# Patient Record
Sex: Male | Born: 1970 | Race: White | Hispanic: No | Smoking: Current some day smoker
Health system: Southern US, Community
[De-identification: ages and names within clinical notes are randomized; demographics above are authoritative.]

---

## 2005-06-19 ENCOUNTER — Emergency Department: Payer: Self-pay | Admitting: Emergency Medicine

## 2009-06-15 ENCOUNTER — Emergency Department: Payer: Self-pay | Admitting: Emergency Medicine

## 2011-07-21 ENCOUNTER — Emergency Department: Payer: Self-pay | Admitting: Emergency Medicine

## 2014-06-03 ENCOUNTER — Emergency Department: Payer: Self-pay | Admitting: Emergency Medicine

## 2014-06-03 LAB — URINALYSIS, COMPLETE
BACTERIA: NONE SEEN
BILIRUBIN, UR: NEGATIVE
BLOOD: NEGATIVE
Glucose,UR: NEGATIVE mg/dL (ref 0–75)
KETONE: NEGATIVE
LEUKOCYTE ESTERASE: NEGATIVE
Nitrite: NEGATIVE
Ph: 6 (ref 4.5–8.0)
Protein: NEGATIVE
RBC,UR: <1 /HPF (ref 0–5)
SPECIFIC GRAVITY: 1.024 (ref 1.003–1.030)
Squamous Epithelial: <1
WBC UR: <1 /HPF (ref 0–5)

## 2017-03-31 ENCOUNTER — Encounter: Payer: Self-pay | Admitting: Emergency Medicine

## 2017-03-31 ENCOUNTER — Emergency Department
Admission: EM | Admit: 2017-03-31 | Discharge: 2017-03-31 | Disposition: A | Discharge status: Home / Self Care | Payer: Self-pay | Attending: Emergency Medicine | Admitting: Emergency Medicine

## 2017-03-31 ENCOUNTER — Emergency Department: Payer: Self-pay

## 2017-03-31 ENCOUNTER — Other Ambulatory Visit: Payer: Self-pay

## 2017-03-31 DIAGNOSIS — W11XXXA Fall on and from ladder, initial encounter: Secondary | ICD-10-CM | POA: Insufficient documentation

## 2017-03-31 DIAGNOSIS — Y939 Activity, unspecified: Secondary | ICD-10-CM | POA: Insufficient documentation

## 2017-03-31 DIAGNOSIS — Y999 Unspecified external cause status: Secondary | ICD-10-CM | POA: Insufficient documentation

## 2017-03-31 DIAGNOSIS — Y929 Unspecified place or not applicable: Secondary | ICD-10-CM | POA: Insufficient documentation

## 2017-03-31 DIAGNOSIS — S92001A Unspecified fracture of right calcaneus, initial encounter for closed fracture: Secondary | ICD-10-CM | POA: Insufficient documentation

## 2017-03-31 DIAGNOSIS — F17200 Nicotine dependence, unspecified, uncomplicated: Secondary | ICD-10-CM | POA: Insufficient documentation

## 2017-03-31 MED ORDER — NAPROXEN 500 MG PO TABS
500.0000 mg | ORAL_TABLET | Freq: Once | ORAL | Status: AC
  Administered 2017-03-31: 500 mg via ORAL

## 2017-03-31 MED ORDER — IBUPROFEN 800 MG PO TABS: 800.0000 mg | ORAL_TABLET | Freq: Three times a day (TID) | ORAL | 0 refills | Status: DC | PRN

## 2017-03-31 MED ORDER — OXYCODONE-ACETAMINOPHEN 7.5-325 MG PO TABS: 1.0000 | ORAL_TABLET | Freq: Four times a day (QID) | ORAL | 0 refills | Status: DC | PRN

## 2017-03-31 MED ORDER — OXYCODONE-ACETAMINOPHEN 5-325 MG PO TABS
1.0000 | ORAL_TABLET | ORAL | Status: DC | PRN
  Administered 2017-03-31: 1 via ORAL
  Filled 2017-03-31: qty 1

## 2017-03-31 MED ORDER — NAPROXEN 500 MG PO TABS
ORAL_TABLET | ORAL | Status: AC
  Filled 2017-03-31: qty 1

## 2017-03-31 NOTE — Discharge Instructions (Signed)
Wear splint and ambulate with crutches until evaluation/treatment by orthopedic clinic.  He called today to schedule appointment.

## 2017-03-31 NOTE — ED Triage Notes (Signed)
First nurse note: left heel pain.  Pt states fell off ladder 12 feet in air.  States landed on feet.  Pt reports pain up into left leg and hip.

## 2017-03-31 NOTE — ED Provider Notes (Signed)
[facility] Emergency Department Provider Note   ____________________________________________   First MD Initiated Contact with Patient 03/31/17 1222     (approximate)  I have reviewed the triage vital signs and the nursing notes.   HISTORY  Chief Complaint Foot Pain    HPI Jeffrey Sartoriusaul D Benton is a 47 y.o. male patient state he lost balance and fell from a ladder emblematic shatter his left heel.  Patient unable to bear weight status post fall.  Patient fell approximately 12 feet.  Patient rates the pain as a 8/10.  Patient described the pain as "aching".  No pulses measured prior to arrival.  History reviewed. No pertinent past medical history.  There are no active problems to display for this patient.   History reviewed. No pertinent surgical history.  Prior to Admission medications   Medication Sig Start Date End Date Taking? Authorizing Provider  ibuprofen (ADVIL,MOTRIN) 800 MG tablet Take 1 tablet (800 mg total) by mouth every 8 (eight) hours as needed. 03/31/17   Joni ReiningSmith, Ronald K, PA-C  oxyCODONE-acetaminophen (PERCOCET) 7.5-325 MG tablet Take 1 tablet by mouth every 6 (six) hours as needed for severe pain. 03/31/17   Joni ReiningSmith, Ronald K, PA-C    Allergies Patient has no known allergies.  No family history on file.  Social History Social History   Tobacco Use  . Smoking status: Current Some Day Smoker  . Smokeless tobacco: Never Used  Substance Use Topics  . Alcohol use: Yes  . Drug use: No    Review of Systems Constitutional: No fever/chills Eyes: No visual changes. ENT: No sore throat. Cardiovascular: Denies chest pain. Respiratory: Denies shortness of breath. Gastrointestinal: No abdominal pain.  No nausea, no vomiting.  No diarrhea.  No constipation. Genitourinary: Negative for dysuria. Musculoskeletal: Left heel pain. Skin: Negative for rash. Neurological: Negative for headaches, focal weakness or  numbness.   ____________________________________________   PHYSICAL EXAM:  VITAL SIGNS: ED Triage Vitals  Enc Vitals Group     BP 03/31/17 1153 (!) 151/73     Pulse Rate 03/31/17 1153 77     Resp 03/31/17 1153 20     Temp 03/31/17 1153 97.7 F (36.5 C)     Temp Source 03/31/17 1153 Oral     SpO2 03/31/17 1153 98 %     Weight 03/31/17 1154 175 lb (79.4 kg)     Height --      Head Circumference --      Peak Flow --      Pain Score 03/31/17 1153 8     Pain Loc --      Pain Edu? --      Excl. in GC? --    Constitutional: Alert and oriented. Well appearing and in no acute distress. Cardiovascular: Normal rate, regular rhythm. Grossly normal heart sounds.  Good peripheral circulation.  Elevated blood pressure Respiratory: Normal respiratory effort.  No retractions. Lungs CTAB. Musculoskeletal: No obvious deformity to the left heel moderate edema is appreciated. Neurologic:  Normal speech and language. No gross focal neurologic deficits are appreciated. No gait instability. Skin:  Skin is warm, dry and intact. No rash noted. Psychiatric: Mood and affect are normal. Speech and behavior are normal.  ____________________________________________   LABS (all labs ordered are listed, but only abnormal results are displayed)  Labs Reviewed - No data to display ____________________________________________  EKG   ____________________________________________  RADIOLOGY  Dg Foot Complete Left  Result Date: 03/31/2017 CLINICAL DATA:  Fall from ladder with left  foot pain, initial encounter EXAM: LEFT FOOT - COMPLETE 3+ VIEW COMPARISON:  None. FINDINGS: There is a comminuted fracture involving the calcaneus with significant impaction of the fracture fragments identified. The distal tibia and fibula appear within normal limits. The talus as visualized is unremarkable. No other fractures are seen. IMPRESSION: Severely comminuted calcaneal fracture with impaction Electronically Signed    By: Alcide Clever M.D.   On: 03/31/2017 12:45    ____________________________________________   PROCEDURES  Procedure(s) performed: None  .Splint Application Date/Time: 03/31/2017 1:17 PM Performed by: Lindajo Royal, NT Authorized by: Joni Reining, PA-C   Consent:    Consent obtained:  Verbal   Consent given by:  Patient   Risks discussed:  Numbness and swelling Pre-procedure details:    Sensation:  Normal Procedure details:    Laterality:  Left   Location:  Ankle   Ankle:  L ankle   Strapping: no     Splint type:  Short leg   Supplies:  Cotton padding and Ortho-Glass Post-procedure details:    Pain:  Unchanged   Sensation:  Normal   Patient tolerance of procedure:  Tolerated well, no immediate complications    Critical Care performed: No  ____________________________________________   INITIAL IMPRESSION / ASSESSMENT AND PLAN / ED COURSE  As part of my medical decision making, I reviewed the following data within the electronic MEDICAL RECORD NUMBER    Discussed x-ray findings patient revealed a comminuted left heel fracture.  Discussed patient with on-call orthopedic surgeon who will follow up with patient tomorrow.  Patient placed in a posterior splint and given crutches for ambulation.  Patient advised take medication as directed.  Status post application of splint patient was found to be neurovascular intact.      ____________________________________________   FINAL CLINICAL IMPRESSION(S) / ED DIAGNOSES  Final diagnoses:  Closed nondisplaced fracture of right calcaneus, unspecified portion of calcaneus, initial encounter     ED Discharge Orders        Ordered    oxyCODONE-acetaminophen (PERCOCET) 7.5-325 MG tablet  Every 6 hours PRN     03/31/17 1307    ibuprofen (ADVIL,MOTRIN) 800 MG tablet  Every 8 hours PRN     03/31/17 1307       Note:  This document was prepared using Dragon voice recognition software and may include unintentional  dictation errors.    Joni Reining, PA-C 03/31/17 1317    Joni Reining, PA-C 03/31/17 1320    Sharman Cheek, MD 03/31/17 1524

## 2017-03-31 NOTE — ED Notes (Signed)
Was discharging the patient and then it was decided to do mri.

## 2017-03-31 NOTE — ED Notes (Signed)
See triage note  States he fell from ladder approx [id number] landed on feet  Having pain to left heel  Swelling and bruising noted to ankle area .  Positive pulses

## 2017-03-31 NOTE — ED Triage Notes (Signed)
Pt states he lost his balance while on a ladder then jumped of ladder and thinks he might have shattered his left heel. Unable to bear weight.LROM.

## 2017-12-28 ENCOUNTER — Emergency Department
Admission: EM | Admit: 2017-12-28 | Discharge: 2017-12-28 | Disposition: A | Discharge status: Home / Self Care | Payer: Self-pay | Attending: Emergency Medicine | Admitting: Emergency Medicine

## 2017-12-28 ENCOUNTER — Other Ambulatory Visit: Payer: Self-pay

## 2017-12-28 DIAGNOSIS — Z79899 Other long term (current) drug therapy: Secondary | ICD-10-CM | POA: Insufficient documentation

## 2017-12-28 DIAGNOSIS — R002 Palpitations: Secondary | ICD-10-CM | POA: Insufficient documentation

## 2017-12-28 DIAGNOSIS — F17200 Nicotine dependence, unspecified, uncomplicated: Secondary | ICD-10-CM | POA: Insufficient documentation

## 2017-12-28 DIAGNOSIS — I471 Supraventricular tachycardia: Secondary | ICD-10-CM | POA: Insufficient documentation

## 2017-12-28 LAB — MAGNESIUM: MAGNESIUM: 2 mg/dL (ref 1.7–2.4)

## 2017-12-28 LAB — CBC WITH DIFFERENTIAL/PLATELET
Abs Immature Granulocytes: 0.06 10*3/uL (ref 0.00–0.07)
Basophils Absolute: 0.1 10*3/uL (ref 0.0–0.1)
Basophils Relative: 1 %
EOS PCT: 2 %
Eosinophils Absolute: 0.2 10*3/uL (ref 0.0–0.5)
HCT: 41.2 % (ref 39.0–52.0)
HEMOGLOBIN: 13.7 g/dL (ref 13.0–17.0)
Immature Granulocytes: 1 %
LYMPHS PCT: 19 %
Lymphs Abs: 2.2 10*3/uL (ref 0.7–4.0)
MCH: 29.5 pg (ref 26.0–34.0)
MCHC: 33.3 g/dL (ref 30.0–36.0)
MCV: 88.6 fL (ref 80.0–100.0)
MONO ABS: 0.6 10*3/uL (ref 0.1–1.0)
Monocytes Relative: 5 %
Neutro Abs: 8.8 10*3/uL — ABNORMAL HIGH (ref 1.7–7.7)
Neutrophils Relative %: 72 %
Platelets: 282 10*3/uL (ref 150–400)
RBC: 4.65 MIL/uL (ref 4.22–5.81)
RDW: 13.6 % (ref 11.5–15.5)
WBC: 12 10*3/uL — AB (ref 4.0–10.5)
nRBC: 0 % (ref 0.0–0.2)

## 2017-12-28 LAB — TROPONIN I: Troponin I: <0.03 ng/mL (ref <0.03)

## 2017-12-28 LAB — BASIC METABOLIC PANEL
Anion gap: 6 (ref 5–15)
BUN: 14 mg/dL (ref 6–20)
CHLORIDE: 112 mmol/L — AB (ref 98–111)
CO2: 24 mmol/L (ref 22–32)
CREATININE: 0.74 mg/dL (ref 0.61–1.24)
Calcium: 9.1 mg/dL (ref 8.9–10.3)
GFR calc Af Amer: >60 mL/min (ref >60)
Glucose, Bld: 93 mg/dL (ref 70–99)
Potassium: 4 mmol/L (ref 3.5–5.1)
SODIUM: 142 mmol/L (ref 135–145)

## 2017-12-28 LAB — TSH: TSH: 1.457 u[IU]/mL (ref 0.350–4.500)

## 2017-12-28 LAB — T4, FREE: Free T4: 0.71 ng/dL — ABNORMAL LOW (ref 0.82–1.77)

## 2017-12-28 MED ORDER — METOPROLOL TARTRATE 25 MG PO TABS: 25.0000 mg | ORAL_TABLET | Freq: Two times a day (BID) | ORAL | 0 refills | Status: AC

## 2017-12-28 NOTE — ED Triage Notes (Signed)
PT was at work and started having chest pain. EMS reported SVT w/ rate of 170's. Pt converted on his own and is stable at this time and denies any pain.

## 2017-12-28 NOTE — ED Provider Notes (Signed)
[facility] Emergency Department Provider Note  ____________________________________________  Time seen: Approximately 1:49 PM  I have reviewed the triage vital signs and the nursing notes.   HISTORY  Chief Complaint Palpitations    HPI Jeffrey Benton is a 47 y.o. male with no significant past medical history who complains of intermittent chest tightness over the past several days.  Not exertional, not pleuritic.  No associated shortness of breath diaphoresis vomiting or dizziness.  Last night he also is noticing frequent irregular heartbeat, and today he had an episode where he suddenly became very rapid heartbeat associated with feeling jittery.  EMS report that the patient was in SVT which converted spontaneously to sinus rhythm during transport.  Patient denies any current complaints.  No stimulant abuse, excessive nicotine or caffeine, no decongestants.  Takes no medicines.  Never had anything like this before.      History reviewed. No pertinent past medical history.   There are no active problems to display for this patient.    History reviewed. No pertinent surgical history.   Prior to Admission medications   Medication Sig Start Date End Date Taking? Authorizing Provider  ibuprofen (ADVIL,MOTRIN) 800 MG tablet Take 1 tablet (800 mg total) by mouth every 8 (eight) hours as needed. 03/31/17   Joni Reining, PA-C  metoprolol tartrate (LOPRESSOR) 25 MG tablet Take 1 tablet (25 mg total) by mouth 2 (two) times daily. 12/28/17 12/28/18  Sharman Cheek, MD  oxyCODONE-acetaminophen (PERCOCET) 7.5-325 MG tablet Take 1 tablet by mouth every 6 (six) hours as needed for severe pain. 03/31/17   Joni Reining, PA-C     Allergies Patient has no known allergies.   History reviewed. No pertinent family history.  Social History Social History   Tobacco Use  . Smoking status: Current Some Day Smoker  . Smokeless tobacco: Never Used  Substance  Use Topics  . Alcohol use: Yes  . Drug use: No    Review of Systems  Constitutional:   No fever or chills.  ENT:   No sore throat. No rhinorrhea. Cardiovascular: Positive as above chest pain without syncope.  Positive palpitations Respiratory:   No dyspnea or cough. Gastrointestinal:   Negative for abdominal pain, vomiting and diarrhea.  Musculoskeletal:   Negative for focal pain or swelling All other systems reviewed and are negative except as documented above in ROS and HPI.  ____________________________________________   PHYSICAL EXAM:  VITAL SIGNS: ED Triage Vitals  Enc Vitals Group     BP 12/28/17 1113 (!) 144/113     Pulse Rate 12/28/17 1113 74     Resp 12/28/17 1113 14     Temp 12/28/17 1113 97.9 F (36.6 C)     Temp Source 12/28/17 1113 Oral     SpO2 12/28/17 1113 98 %     Weight 12/28/17 1113 170 lb (77.1 kg)     Height 12/28/17 1113 5\' 10"  (1.778 m)     Head Circumference --      Peak Flow --      Pain Score 12/28/17 1109 0     Pain Loc --      Pain Edu? --      Excl. in GC? --     Vital signs reviewed, nursing assessments reviewed.   Constitutional:   Alert and oriented. Non-toxic appearance. Eyes:   Conjunctivae are normal. EOMI. PERRL. ENT      Head:   Normocephalic and atraumatic.      Nose:  No congestion/rhinnorhea.       Mouth/Throat:   MMM, no pharyngeal erythema. No peritonsillar mass.       Neck:   No meningismus. Full ROM. Hematological/Lymphatic/Immunilogical:   No cervical lymphadenopathy. Cardiovascular:   RRR. Symmetric bilateral radial and DP pulses.  No murmurs. Cap refill less than 2 seconds. Respiratory:   Normal respiratory effort without tachypnea/retractions. Breath sounds are clear and equal bilaterally. No wheezes/rales/rhonchi. Gastrointestinal:   Soft and nontender. Non distended. There is no CVA tenderness.  No rebound, rigidity, or guarding. Musculoskeletal:   Normal range of motion in all extremities. No joint effusions.   No lower extremity tenderness.  No edema. Neurologic:   Normal speech and language.  Motor grossly intact. No acute focal neurologic deficits are appreciated.  Skin:    Skin is warm, dry and intact. No rash noted.  No petechiae, purpura, or bullae.  ____________________________________________    LABS (pertinent positives/negatives) (all labs ordered are listed, but only abnormal results are displayed) Labs Reviewed  BASIC METABOLIC PANEL - Abnormal; Notable for the following components:      Result Value   Chloride 112 (*)    All other components within normal limits  CBC WITH DIFFERENTIAL/PLATELET - Abnormal; Notable for the following components:   WBC 12.0 (*)    Neutro Abs 8.8 (*)    All other components within normal limits  T4, FREE - Abnormal; Notable for the following components:   Free T4 0.71 (*)    All other components within normal limits  MAGNESIUM  TSH  TROPONIN I   ____________________________________________   EKG  Interpreted by me  Date: 12/28/2017  Rate: 75  Rhythm: normal sinus rhythm  QRS Axis: normal  Intervals: normal  ST/T Wave abnormalities: normal  Conduction Disutrbances: none  Narrative Interpretation: unremarkable      ____________________________________________    RADIOLOGY  No results found.  ____________________________________________   PROCEDURES Procedures  ____________________________________________    CLINICAL IMPRESSION / ASSESSMENT AND PLAN / ED COURSE  Pertinent labs & imaging results that were available during my care of the patient were reviewed by me and considered in my medical decision making (see chart for details).    Patient presents with atypical chest pain and help rotations, and an episode of SVT as reported by EMS earlier today.  In the ED he is in sinus rhythm, not requiring any interventions.  Asymptomatic.  Labs including troponin thyroid studies and electrolytes all unremarkable.   Recommended twice daily metoprolol and follow-up with cardiology for further assessment.    Clinical Course as of Dec 29 1347  Mon Dec 28, 2017  1319 Labs negative.  With patient's frequent palpitations and episode of SVT today, will plan to refer to cardiology, start low-dose metoprolol for rate and symptom control.  Patient takes aspirin daily already which I advised him to continue until he sees cardiology.   [PS]    Clinical Course User Index [PS] Sharman Cheek, MD     ____________________________________________   FINAL CLINICAL IMPRESSION(S) / ED DIAGNOSES    Final diagnoses:  Palpitations  SVT (supraventricular tachycardia) [facility])     ED Discharge Orders         Ordered    metoprolol tartrate (LOPRESSOR) 25 MG tablet  2 times daily     12/28/17 1343          Portions of this note were generated with dragon dictation software. Dictation errors may occur despite best attempts at proofreading.    Scotty Court,  Aneta Mins, MD 12/28/17 1352

## 2017-12-28 NOTE — Discharge Instructions (Signed)
Take metoprolol 2 times a day as prescribed to control your symptoms and follow-up with cardiology for further assessment.

## 2017-12-30 ENCOUNTER — Telehealth: Payer: Self-pay

## 2017-12-30 NOTE — Telephone Encounter (Signed)
Lmov for patient to schedule ED fu seen on 12/28/17   Will try again at a later time

## 2017-12-31 NOTE — Telephone Encounter (Signed)
Lmov for patient to schedule ED fu seen on 12/28/17  ° °Will try again at a later time  °

## 2018-01-01 NOTE — Telephone Encounter (Signed)
Lmov for patient to schedule ED fu seen on 12/28/17  ° °Will try again at a later time  °

## 2019-01-23 ENCOUNTER — Emergency Department: Payer: Self-pay

## 2019-01-23 ENCOUNTER — Emergency Department
Admission: EM | Admit: 2019-01-23 | Discharge: 2019-01-23 | Disposition: A | Discharge status: Home / Self Care | Payer: Self-pay | Attending: Emergency Medicine | Admitting: Emergency Medicine

## 2019-01-23 ENCOUNTER — Other Ambulatory Visit: Payer: Self-pay

## 2019-01-23 DIAGNOSIS — F17210 Nicotine dependence, cigarettes, uncomplicated: Secondary | ICD-10-CM | POA: Insufficient documentation

## 2019-01-23 DIAGNOSIS — N50812 Left testicular pain: Secondary | ICD-10-CM | POA: Insufficient documentation

## 2019-01-23 DIAGNOSIS — L739 Follicular disorder, unspecified: Secondary | ICD-10-CM

## 2019-01-23 DIAGNOSIS — N50811 Right testicular pain: Secondary | ICD-10-CM | POA: Insufficient documentation

## 2019-01-23 DIAGNOSIS — N50819 Testicular pain, unspecified: Secondary | ICD-10-CM

## 2019-01-23 DIAGNOSIS — R319 Hematuria, unspecified: Secondary | ICD-10-CM

## 2019-01-23 LAB — CBC
HCT: 48.1 % (ref 39.0–52.0)
Hemoglobin: 16.3 g/dL (ref 13.0–17.0)
MCH: 29.3 pg (ref 26.0–34.0)
MCHC: 33.9 g/dL (ref 30.0–36.0)
MCV: 86.5 fL (ref 80.0–100.0)
Platelets: 386 10*3/uL (ref 150–400)
RBC: 5.56 MIL/uL (ref 4.22–5.81)
RDW: 12.8 % (ref 11.5–15.5)
WBC: 10.8 10*3/uL — ABNORMAL HIGH (ref 4.0–10.5)
nRBC: 0 % (ref 0.0–0.2)

## 2019-01-23 LAB — URINALYSIS, COMPLETE (UACMP) WITH MICROSCOPIC
Bacteria, UA: NONE SEEN
Bilirubin Urine: NEGATIVE
Glucose, UA: NEGATIVE mg/dL
Hgb urine dipstick: NEGATIVE
Ketones, ur: NEGATIVE mg/dL
Leukocytes,Ua: NEGATIVE
Nitrite: NEGATIVE
Protein, ur: NEGATIVE mg/dL
Specific Gravity, Urine: 1.026 (ref 1.005–1.030)
Squamous Epithelial / LPF: NONE SEEN (ref 0–5)
pH: 5 (ref 5.0–8.0)

## 2019-01-23 LAB — BASIC METABOLIC PANEL
Anion gap: 11 (ref 5–15)
BUN: 16 mg/dL (ref 6–20)
CO2: 25 mmol/L (ref 22–32)
Calcium: 9.6 mg/dL (ref 8.9–10.3)
Chloride: 103 mmol/L (ref 98–111)
Creatinine, Ser: 1.12 mg/dL (ref 0.61–1.24)
GFR calc Af Amer: >60 mL/min (ref >60)
GFR calc non Af Amer: >60 mL/min (ref >60)
Glucose, Bld: 83 mg/dL (ref 70–99)
Potassium: 4.4 mmol/L (ref 3.5–5.1)
Sodium: 139 mmol/L (ref 135–145)

## 2019-01-23 MED ORDER — DOXYCYCLINE HYCLATE 100 MG PO TABS: 100.0000 mg | ORAL_TABLET | Freq: Two times a day (BID) | ORAL | 0 refills | Status: DC

## 2019-01-23 MED ORDER — CEFTRIAXONE SODIUM 1 G IJ SOLR
1.0000 g | Freq: Once | INTRAMUSCULAR | Status: AC
  Administered 2019-01-23: 1 g via INTRAMUSCULAR
  Filled 2019-01-23: qty 10

## 2019-01-23 MED ORDER — AZITHROMYCIN 500 MG PO TABS
1000.0000 mg | ORAL_TABLET | Freq: Once | ORAL | Status: AC
  Administered 2019-01-23: 21:00:00 1000 mg via ORAL
  Filled 2019-01-23: qty 2

## 2019-01-23 NOTE — ED Triage Notes (Signed)
Pt states hematuria on and off x 2 weeks. States noticed blood in stool 2 days ago for 1 episode (normal BM). States tenderness to testicles. Denies taking blood thinners. Also c/o 7 abscesses to L axilla. Denies flank pain. Denies dysuria. Denies urinary frequency. Denies fever.

## 2019-01-23 NOTE — ED Notes (Signed)
Will hold 15 min for med hold.

## 2019-01-23 NOTE — ED Provider Notes (Signed)
[facility] Emergency Department Provider Note  ____________________________________________  Time seen: Approximately 7:03 PM  I have reviewed the triage vital signs and the nursing notes.   HISTORY  Chief Complaint Hematuria    HPI Jeffrey Benton is a 48 y.o. male who presents the emergency department with 2 concerns.  Patient has had intermittent hematuria for the past 2 weeks.  Patient reports 5 episodes of bright red blood in the toilet after urinating.  Patient states that he will have some pain in his testicles which is relieved with pressure to his testicles.  He denies any flank pain.  No suprapubic pain.  Patient states that the pain is intermittent as well as the hematuria.  No penile discharge.  Patient is willing to undergo STD testing.  Patient is also complaining of "bumps" to the left axilla.  Patient noticed these lesions developing over the past 2 to 3 days.  They are tender to touch.  No purulent drainage.  No history of recurring skin lesions.  Patient denies any fevers or chills, URI symptoms, chest pain, shortness of breath abdominal pain, nausea or vomiting.         History reviewed. No pertinent past medical history.  There are no active problems to display for this patient.   History reviewed. No pertinent surgical history.  Prior to Admission medications   Medication Sig Start Date End Date Taking? Authorizing Provider  doxycycline (VIBRA-TABS) 100 MG tablet Take 1 tablet (100 mg total) by mouth 2 (two) times daily. 01/23/19   Chucky Homes, Delorise Royals, PA-C  ibuprofen (ADVIL,MOTRIN) 800 MG tablet Take 1 tablet (800 mg total) by mouth every 8 (eight) hours as needed. 03/31/17   Joni Reining, PA-C  metoprolol tartrate (LOPRESSOR) 25 MG tablet Take 1 tablet (25 mg total) by mouth 2 (two) times daily. 12/28/17 12/28/18  Sharman Cheek, MD  oxyCODONE-acetaminophen (PERCOCET) 7.5-325 MG tablet Take 1 tablet by mouth every 6 (six)  hours as needed for severe pain. 03/31/17   Joni Reining, PA-C    Allergies Patient has no known allergies.  History reviewed. No pertinent family history.  Social History Social History   Tobacco Use  . Smoking status: Current Some Day Smoker  . Smokeless tobacco: Never Used  Substance Use Topics  . Alcohol use: Yes  . Drug use: No     Review of Systems  Constitutional: No fever/chills Eyes: No visual changes. No discharge ENT: No upper respiratory complaints. Cardiovascular: no chest pain. Respiratory: no cough. No SOB. Gastrointestinal: No abdominal pain.  No nausea, no vomiting.  No diarrhea.  No constipation. Genitourinary: Negative for dysuria.  Positive for hematuria and testicular pain Musculoskeletal: Negative for musculoskeletal pain. Skin: Negative for rash, abrasions, lacerations, ecchymosis.  Positive for skin lesions to the left axilla Neurological: Negative for headaches, focal weakness or numbness. 10-point ROS otherwise negative.  ____________________________________________   PHYSICAL EXAM:  VITAL SIGNS: ED Triage Vitals  Enc Vitals Group     BP 01/23/19 1720 (!) 155/128     Pulse Rate 01/23/19 1720 (!) 104     Resp --      Temp 01/23/19 1720 98.6 F (37 C)     Temp Source 01/23/19 1720 Oral     SpO2 01/23/19 1720 98 %     Weight 01/23/19 1720 170 lb (77.1 kg)     Height 01/23/19 1720 5\' 10"  (1.778 m)     Head Circumference --      Peak Flow --  Pain Score 01/23/19 1730 0     Pain Loc --      Pain Edu? --      Excl. in GC? --      Constitutional: Alert and oriented. Well appearing and in no acute distress. Eyes: Conjunctivae are normal. PERRL. EOMI. Head: Atraumatic. ENT:      Ears:       Nose: No congestion/rhinnorhea.      Mouth/Throat: Mucous membranes are moist.  Neck: No stridor.   Hematological/Lymphatic/Immunilogical: No cervical lymphadenopathy. Cardiovascular: Normal rate, regular rhythm. Normal S1 and S2.  Good  peripheral circulation. Respiratory: Normal respiratory effort without tachypnea or retractions. Lungs CTAB. Good air entry to the bases with no decreased or absent breath sounds. Gastrointestinal: Bowel sounds 4 quadrants. Soft and nontender to palpation. No guarding or rigidity. No palpable masses. No distention. No CVA tenderness. Genitourinary: No external lesions or chancres identified.  No purulent drainage.  Nontender to palpation along the shaft.  No palpable abnormality about the scrotum or testicles. Musculoskeletal: Full range of motion to all extremities. No gross deformities appreciated. Neurologic:  Normal speech and language. No gross focal neurologic deficits are appreciated.  Skin:  Skin is warm, dry and intact. No rash noted. Psychiatric: Mood and affect are normal. Speech and behavior are normal. Patient exhibits appropriate insight and judgement.   ____________________________________________   LABS (all labs ordered are listed, but only abnormal results are displayed)  Labs Reviewed  URINALYSIS, COMPLETE (UACMP) WITH MICROSCOPIC - Abnormal; Notable for the following components:      Result Value   Color, Urine YELLOW (*)    APPearance CLEAR (*)    All other components within normal limits  CBC - Abnormal; Notable for the following components:   WBC 10.8 (*)    All other components within normal limits  GC/CHLAMYDIA PROBE AMP  BASIC METABOLIC PANEL   ____________________________________________  EKG   ____________________________________________  RADIOLOGY I personally viewed and evaluated these images as part of my medical decision making, as well as reviewing the written report by the radiologist.  Dg Abdomen 1 View  Result Date: 01/23/2019 CLINICAL DATA:  48 year old male with intermittent gross hematuria for 2 weeks. Blood in stool x1 episode. Not on blood thinners. EXAM: ABDOMEN - 1 VIEW COMPARISON:  None. FINDINGS: Negative visible lung bases. Non  obstructed bowel gas pattern. Right upper quadrant calcifications clustered along the lateral margin of the right kidney more resemble cholelithiasis than nephrolithiasis, or alternatively might be costochondral calcifications. No other suspicious calcific foci. Other abdominal and pelvic visceral contours appear normal. No acute osseous abnormality identified. No pneumoperitoneum identified on this supine view. IMPRESSION: 1. Normal bowel gas pattern. 2. Small clustered calcifications in the right upper quadrant more resemble cholelithiasis or costochondral calcifications than nephrolithiasis. Electronically Signed   By: Odessa FlemingH  Hall M.D.   On: 01/23/2019 20:11   [place] Scrotum W/doppler  Result Date: 01/23/2019 CLINICAL DATA:  Initial evaluation for acute testicular pain with macroscopic hematuria. EXAM: SCROTAL ULTRASOUND DOPPLER ULTRASOUND OF THE TESTICLES TECHNIQUE: Complete ultrasound examination of the testicles, epididymis, and other scrotal structures was performed. Color and spectral Doppler ultrasound were also utilized to evaluate blood flow to the testicles. COMPARISON:  None. FINDINGS: Right testicle Measurements: 3.5 x 2.1 x 3.1 cm. No mass or microlithiasis visualized. Left testicle Measurements: 3.4 x 1.9 x 2.3 cm. No mass or microlithiasis visualized. Right epididymis:  Normal in size and appearance. Left epididymis:  Normal in size and appearance. Hydrocele:  None visualized.  Varicocele:  None visualized. Pulsed Doppler interrogation of both testes demonstrates normal low resistance arterial and venous waveforms bilaterally. IMPRESSION: Normal scrotal ultrasound. No findings to explain patient's symptoms identified. Electronically Signed   By: Jeannine Boga M.D.   On: 01/23/2019 20:23    ____________________________________________    PROCEDURES  Procedure(s) performed:    Procedures    Medications  cefTRIAXone (ROCEPHIN) injection 1 g (1 g Intramuscular Given 01/23/19 2043)   azithromycin (ZITHROMAX) tablet 1,000 mg (1,000 mg Oral Given 01/23/19 2043)     ____________________________________________   INITIAL IMPRESSION / ASSESSMENT AND PLAN / ED COURSE  Pertinent labs & imaging results that were available during my care of the patient were reviewed by me and considered in my medical decision making (see chart for details).  Review of the [place] CSRS was performed in accordance of the [place] prior to dispensing any controlled drugs.           Patient's diagnosis is consistent with hematuria, testicular pain, folliculitis.  Patient presented to emergency department with intermittent hematuria x2 weeks.  No hematuria today and none identified on urinalysis.  Patient has also had associated intermittent testicular pain.  Differential included testicular torsion, epididymitis, gonorrhea, chlamydia, urethritis, UTI.  Patient also had several lesions in the left axilla.  Examination is most consistent with folliculitis.  No evidence of abscess requiring incision and drainage.  Patient had abdominal x-ray to evaluate for nephrolithiasis, ultrasound to evaluate for testicular pain.  Both are reassuring at this time.  Patient was treated empirically with Rocephin, Zithromax.  Patient will be also discharged with prescription for doxycycline which will cover any mycoplasma urealyticum as well as cellulitis.  Follow-up primary care or urology as needed.  Patient is given ED precautions to return to the ED for any worsening or new symptoms.     ____________________________________________  FINAL CLINICAL IMPRESSION(S) / ED DIAGNOSES  Final diagnoses:  Testicle pain  Hematuria      NEW MEDICATIONS STARTED DURING THIS VISIT:  ED Discharge Orders         Ordered    doxycycline (VIBRA-TABS) 100 MG tablet  2 times daily     01/23/19 2023              This chart was dictated using voice recognition software/Dragon. Despite best efforts to proofread, errors can  occur which can change the meaning. Any change was purely unintentional.    Darletta Moll, PA-C 01/23/19 2045    Arta Silence, MD 01/23/19 2322

## 2019-02-02 LAB — GC/CHLAMYDIA PROBE AMP
Chlamydia trachomatis, NAA: NEGATIVE
Neisseria Gonorrhoeae by PCR: NEGATIVE

## 2019-10-08 ENCOUNTER — Other Ambulatory Visit: Payer: Self-pay

## 2019-10-08 ENCOUNTER — Emergency Department
Admission: EM | Admit: 2019-10-08 | Discharge: 2019-10-08 | Disposition: A | Discharge status: Home / Self Care | Payer: Medicaid Other | Attending: Emergency Medicine | Admitting: Emergency Medicine

## 2019-10-08 DIAGNOSIS — F17200 Nicotine dependence, unspecified, uncomplicated: Secondary | ICD-10-CM | POA: Insufficient documentation

## 2019-10-08 DIAGNOSIS — R519 Headache, unspecified: Secondary | ICD-10-CM | POA: Insufficient documentation

## 2019-10-08 DIAGNOSIS — H6121 Impacted cerumen, right ear: Secondary | ICD-10-CM | POA: Insufficient documentation

## 2019-10-08 MED ORDER — CARBAMIDE PEROXIDE 6.5 % OT SOLN
5.0000 [drp] | Freq: Once | OTIC | Status: AC
  Administered 2019-10-08: 15:00:00 5 [drp] via OTIC
  Filled 2019-10-08: qty 15

## 2019-10-08 NOTE — ED Provider Notes (Signed)
[facility] Emergency Department Provider Note   ____________________________________________   First MD Initiated Contact with Patient 10/08/19 1429     (approximate)  I have reviewed the triage vital signs and the nursing notes.   HISTORY  Chief Complaint Otalgia   HPI Jeffrey Benton is a 49 y.o. male presents to the ED with complaint of right ear pain that began shortly after swimming last weekend.  Patient has been using over-the-counter swimmer's ear drops and also hydrogen peroxide diluted with water.  He is also used sweet oil and eardrops for swimmer's ear without any relief.  Patient denies any URI symptoms.  He denies any trauma to his ear while swimming.  He does admit that he uses Q-tips to clean his ears.  He rates his pain as a 10/10.        History reviewed. No pertinent past medical history.  There are no problems to display for this patient.   History reviewed. No pertinent surgical history.  Prior to Admission medications   Medication Sig Start Date End Date Taking? Authorizing Provider  metoprolol tartrate (LOPRESSOR) 25 MG tablet Take 1 tablet (25 mg total) by mouth 2 (two) times daily. 12/28/17 12/28/18  Sharman Cheek, MD    Allergies Patient has no known allergies.  History reviewed. No pertinent family history.  Social History Social History   Tobacco Use  . Smoking status: Current Some Day Smoker  . Smokeless tobacco: Never Used  Substance Use Topics  . Alcohol use: Yes  . Drug use: No    Review of Systems Constitutional: No fever/chills Eyes: No visual changes. ENT: No sore throat.  Positive right ear pain. Cardiovascular: Denies chest pain. Respiratory: Denies shortness of breath. Musculoskeletal: Negative for muscle skeletal pain. Skin: Negative for rash. Neurological: Positive for headache. ____________________________________________   PHYSICAL EXAM:  VITAL SIGNS: ED Triage Vitals  Enc  Vitals Group     BP 10/08/19 1337 (!) 147/91     Pulse Rate 10/08/19 1337 77     Resp 10/08/19 1337 18     Temp 10/08/19 1337 98.6 F (37 C)     Temp Source 10/08/19 1337 Oral     SpO2 10/08/19 1337 98 %     Weight 10/08/19 1338 170 lb (77.1 kg)     Height 10/08/19 1338 5\' 11"  (1.803 m)     Head Circumference --      Peak Flow --      Pain Score 10/08/19 1338 10     Pain Loc --      Pain Edu? --      Excl. in GC? --     Constitutional: Alert and oriented. Well appearing and in no acute distress. Eyes: Conjunctivae are normal. PERRL. EOMI. Head: Atraumatic. Nose: No congestion/rhinnorhea. Ears: Left EAC and TM are clear.  Right EAC is occluded with cerumen.  TM is not visible at this time.  There is moderate tenderness on the visualization with a speculum.  No exudate is noted along the canal. Neck: No stridor.   Cardiovascular: Normal rate, regular rhythm. Grossly normal heart sounds.  Good peripheral circulation. Respiratory: Normal respiratory effort.  No retractions. Lungs CTAB. Musculoskeletal: Is able move upper and lower extremities with any difficulty normal gait was noted. Neurologic:  Normal speech and language. No gross focal neurologic deficits are appreciated.  Skin:  Skin is warm, dry and intact. No rash noted. Psychiatric: Mood and affect are normal. Speech and behavior are normal.  ____________________________________________   LABS (all labs ordered are listed, but only abnormal results are displayed)  Labs Reviewed - No data to display  PROCEDURES  Procedure(s) performed (including Critical Care):  Procedures   ____________________________________________   INITIAL IMPRESSION / ASSESSMENT AND PLAN / ED COURSE  As part of my medical decision making, I reviewed the following data within the electronic MEDICAL RECORD NUMBER Notes from prior ED visits and [place] Controlled Substance Database  ADRIENE PADULA was evaluated in Emergency Department on 10/08/2019  for the symptoms described in the history of present illness. He was evaluated in the context of the global COVID-19 pandemic, which necessitated consideration that the patient might be at risk for infection with the SARS-CoV-2 virus that causes COVID-19. Institutional protocols and algorithms that pertain to the evaluation of patients at risk for COVID-19 are in a state of rapid change based on information released by regulatory bodies including the CDC and federal and state organizations. These policies and algorithms were followed during the patient's care in the ED.  Patient has a cerumen impaction.  Debrox was placed in the ear canal and after approximately 30 minutes was irrigated with the canal completely clean and TM is visible.  Patient states is feeling much better and that he actually can hear.  Patient is encouraged not to use Q-tips in the future.  He is to follow-up with Dr. Jenne Campus if any continued problems with his ear.  ____________________________________________   FINAL CLINICAL IMPRESSION(S) / ED DIAGNOSES  Final diagnoses:  Impacted cerumen of right ear     ED Discharge Orders    None       Note:  This document was prepared using Dragon voice recognition software and may include unintentional dictation errors.    Tommi Rumps, PA-C 10/08/19 1630    Delton Prairie, MD 10/08/19 [phone]

## 2019-10-08 NOTE — ED Notes (Signed)
See triage note  Presents with right ear pain  States pain started several days ago

## 2019-10-08 NOTE — ED Triage Notes (Signed)
Pt arrives via POV with c/o a "massive ear ache". Pt reports getting water in the right ear while swimming last Sunday. Pt tried sweet oil, peroxide and water/alcohol mixture and swimmers ear drops with no relief. Pt reports the whole right side of the face hurts. Pt ambulatory from triage in NAD, skin warm and dry.

## 2019-10-08 NOTE — Discharge Instructions (Addendum)
Follow-up with your primary care provider or Dr. Jenne Campus if any continued problems.  Do not use Q-tips inside your ear.

## 2020-08-21 IMAGING — US US SCROTUM W/ DOPPLER COMPLETE
1 series · 14 of 25 positions shown · non-contrast
Comparison: None.

CLINICAL DATA: Initial evaluation for acute testicular pain with
macroscopic hematuria.

EXAM:
SCROTAL ULTRASOUND
DOPPLER ULTRASOUND OF THE TESTICLES
TECHNIQUE: Complete ultrasound examination of the testicles, epididymis, and
other scrotal structures was performed. Color and spectral Doppler
ultrasound were also utilized to evaluate blood flow to the
testicles.

[Series 1: us scrotum w/ doppler complete · 14 of 67 slices shown]
[im 1/67]
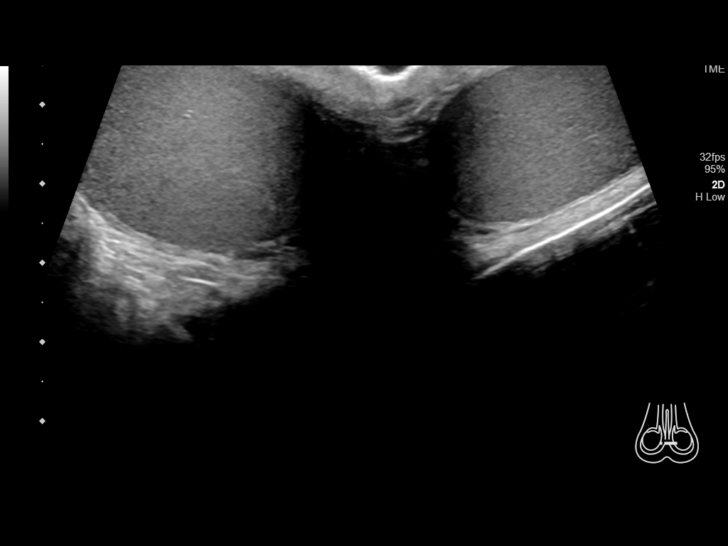
[im 6/67]
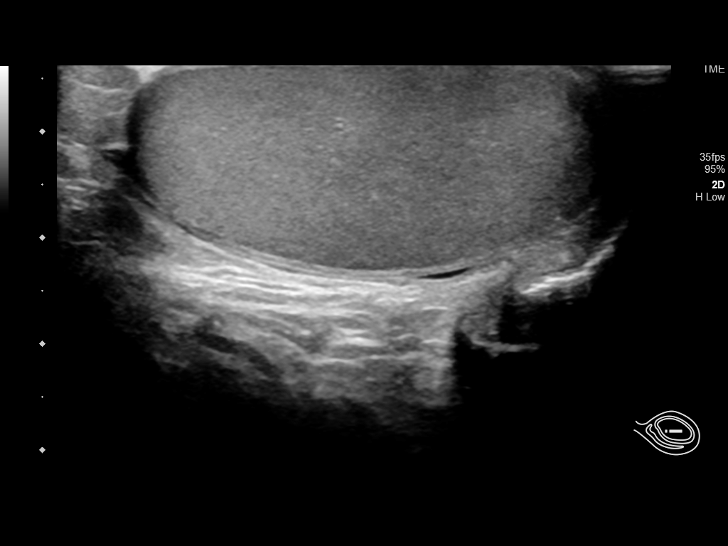
[im 12/67]
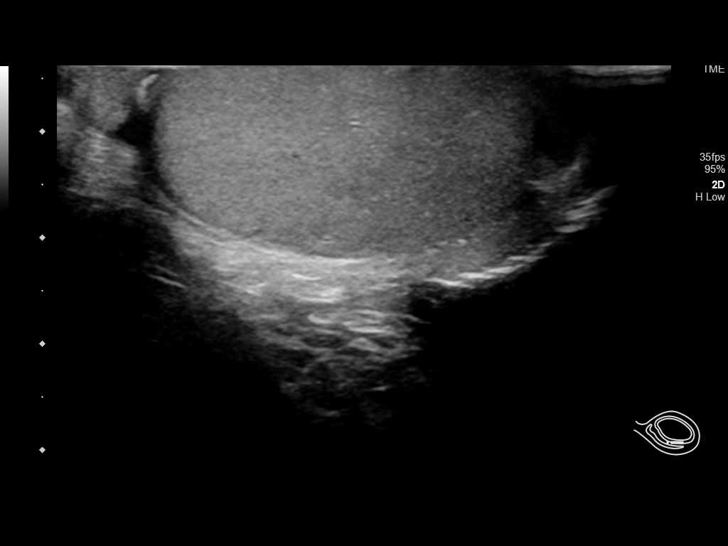
[im 17/67]
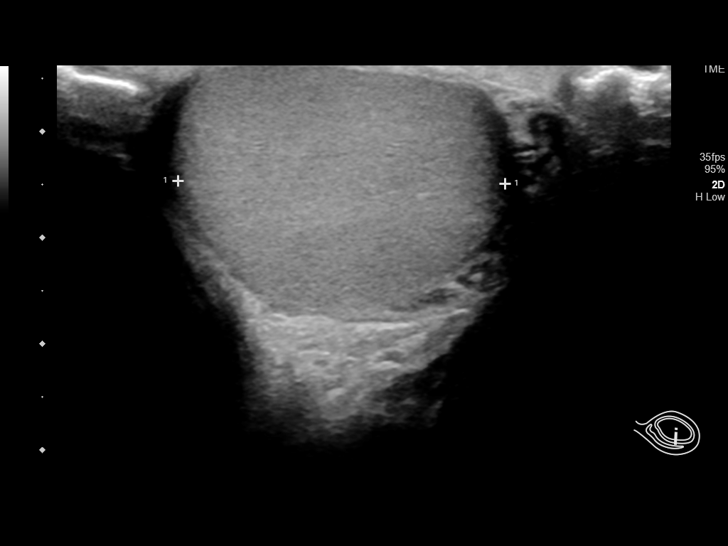
[im 23/67]
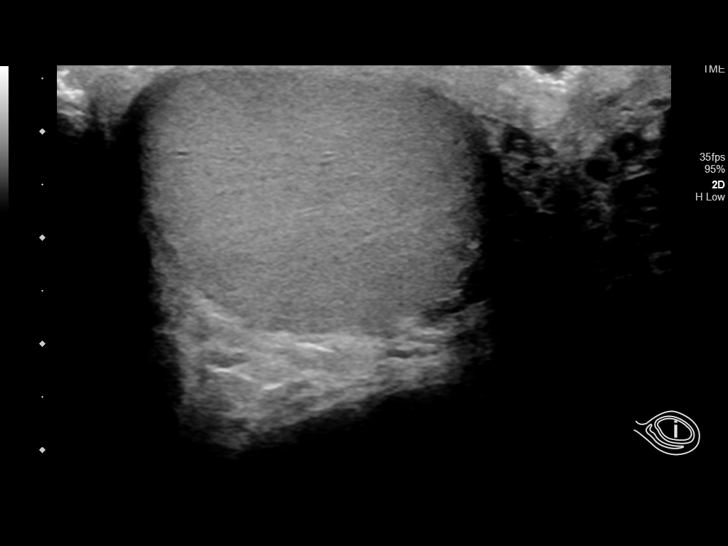
[im 25/67]
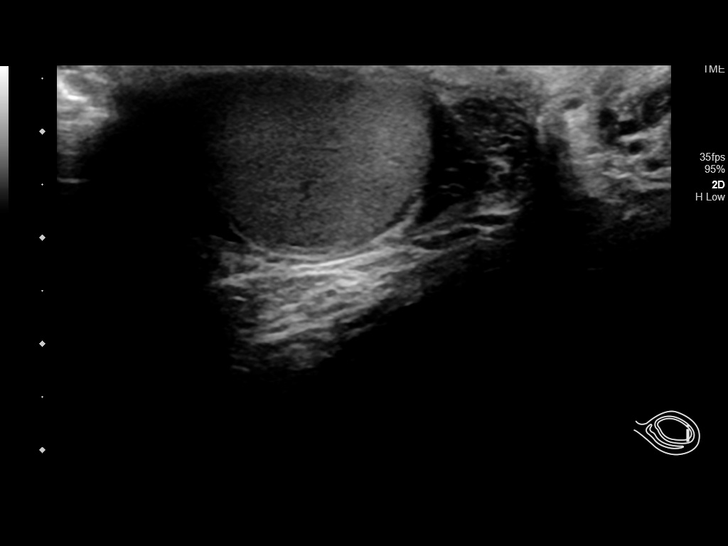
[im 31/67]
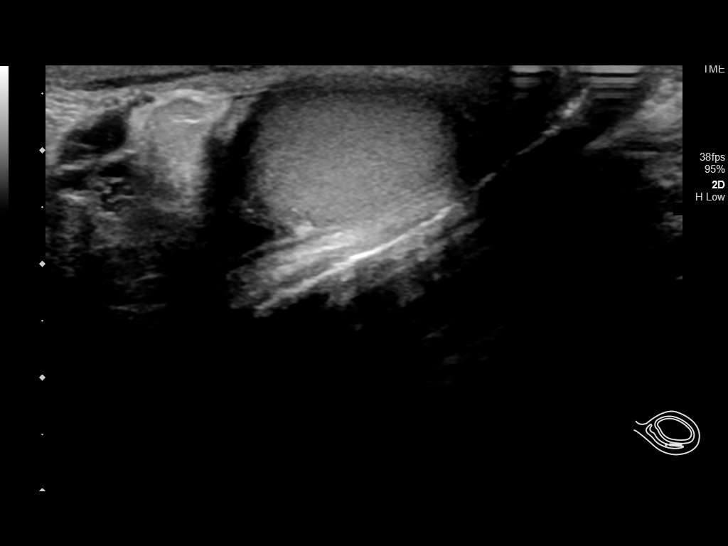
[im 36/67]
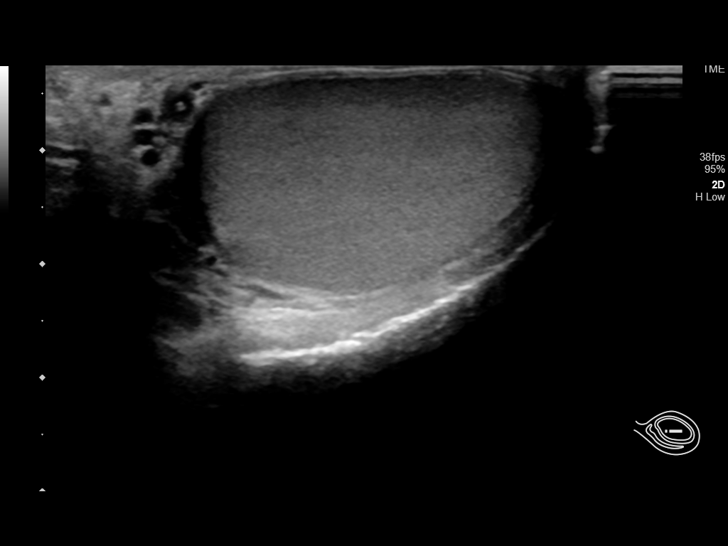
[im 42/67]
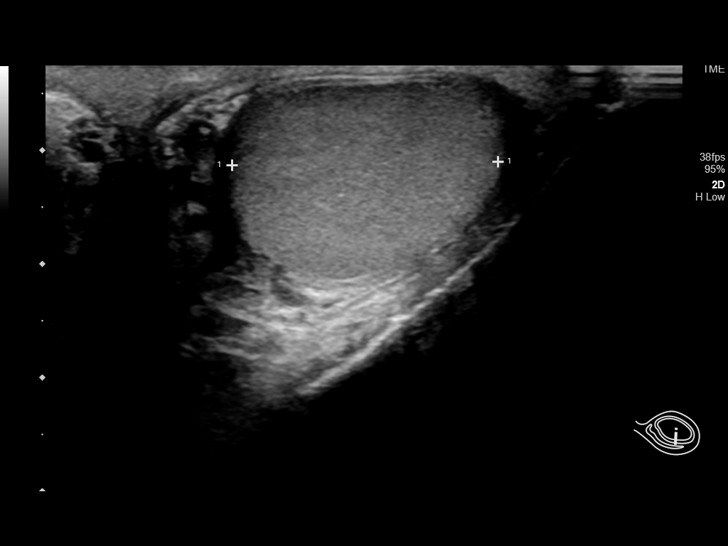
[im 45/67]
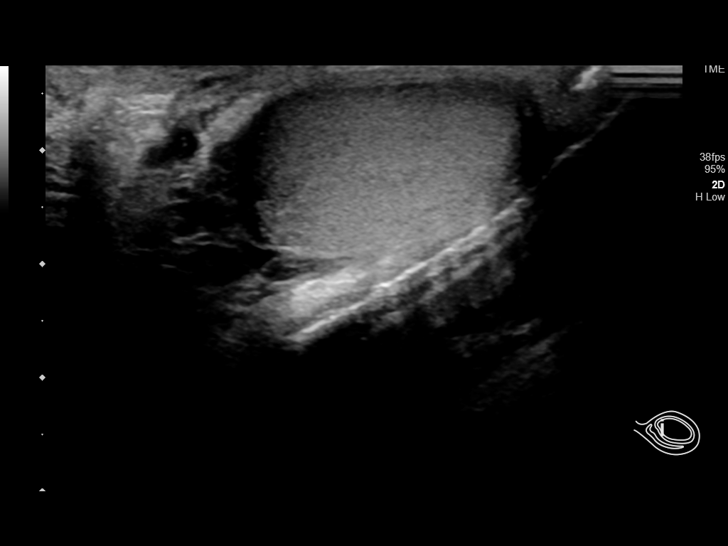
[im 50/67]
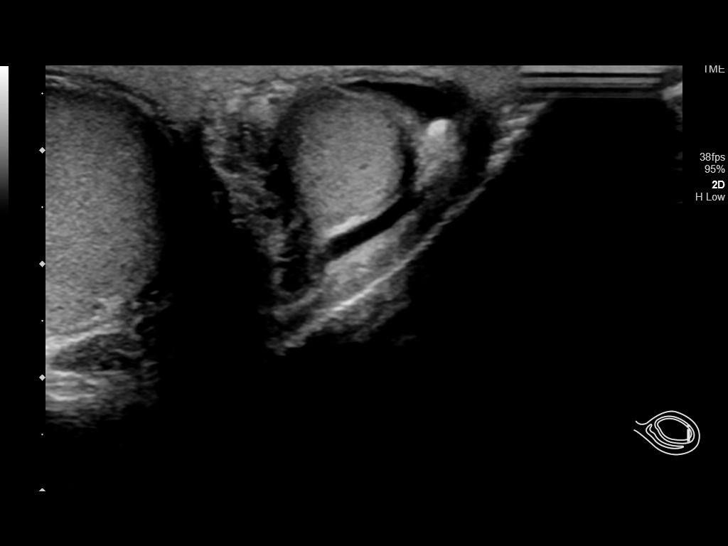
[im 56/67]
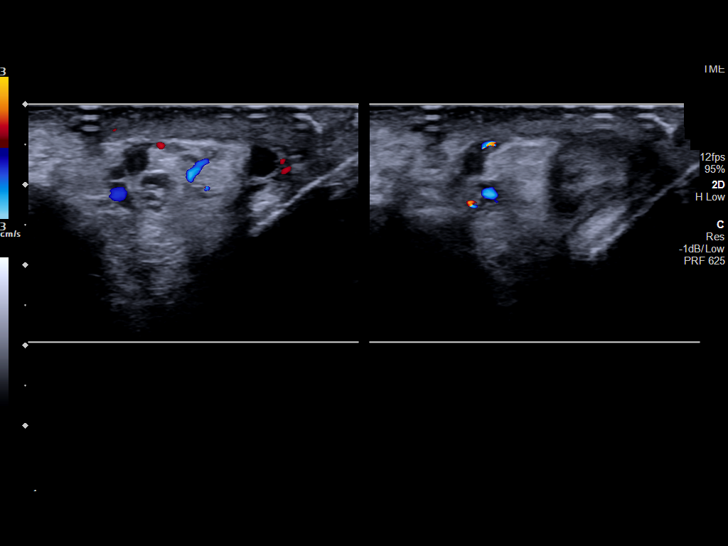
[im 61/67]
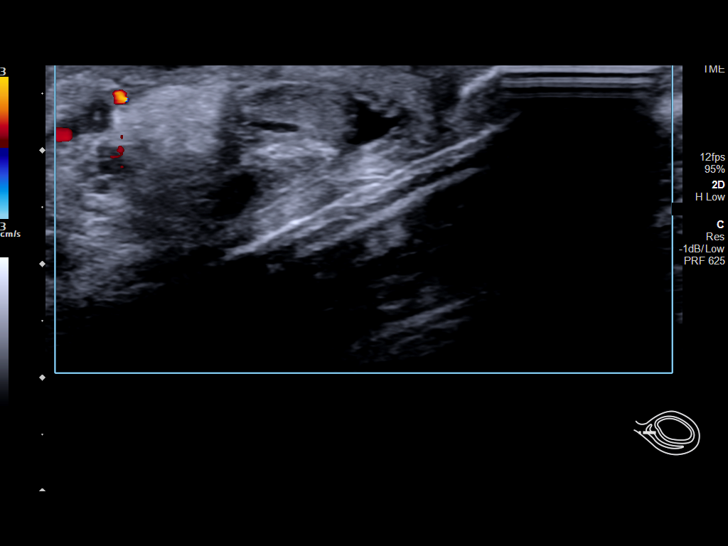
[im 67/67]
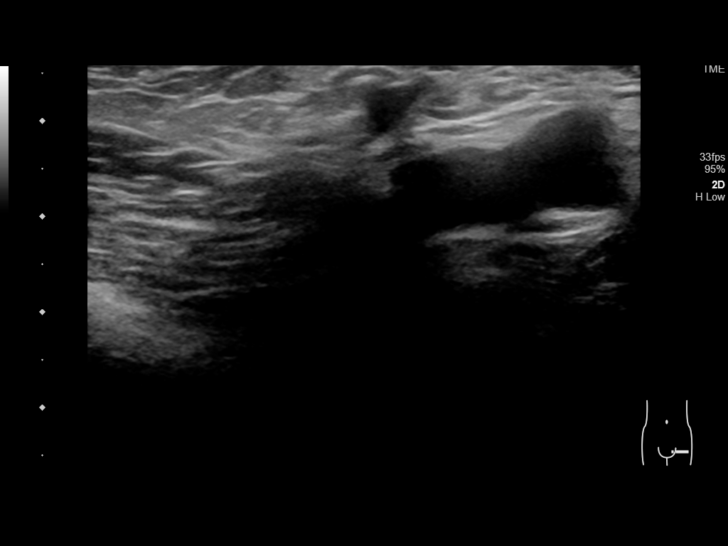

[14 of 25 positions shown; findings below may reference images not displayed]

FINDINGS: Right testicle

Measurements: 3.5 x 2.1 x 3.1 cm. No mass or microlithiasis
visualized.

Left testicle

Measurements: 3.4 x 1.9 x 2.3 cm. No mass or microlithiasis
visualized.

Right epididymis:  Normal in size and appearance.

Left epididymis:  Normal in size and appearance.

Hydrocele:  None visualized.

Varicocele:  None visualized.

Pulsed Doppler interrogation of both testes demonstrates normal low
resistance arterial and venous waveforms bilaterally.
IMPRESSION: Normal scrotal ultrasound. No findings to explain patient's symptoms
identified.

## 2020-12-10 ENCOUNTER — Other Ambulatory Visit: Payer: Self-pay

## 2020-12-10 ENCOUNTER — Emergency Department: Payer: Self-pay

## 2020-12-10 ENCOUNTER — Emergency Department
Admission: EM | Admit: 2020-12-10 | Discharge: 2020-12-10 | Disposition: A | Discharge status: Home / Self Care | Payer: Self-pay | Attending: Emergency Medicine | Admitting: Emergency Medicine

## 2020-12-10 DIAGNOSIS — M7051 Other bursitis of knee, right knee: Secondary | ICD-10-CM | POA: Insufficient documentation

## 2020-12-10 DIAGNOSIS — Y93E5 Activity, floor mopping and cleaning: Secondary | ICD-10-CM | POA: Insufficient documentation

## 2020-12-10 DIAGNOSIS — F17200 Nicotine dependence, unspecified, uncomplicated: Secondary | ICD-10-CM | POA: Insufficient documentation

## 2020-12-10 NOTE — ED Notes (Signed)
See triage note  presents with pain and swelling to right knee  denies any injury  but states he was on his knees putting in a floor  redness noted with some warmth to knee  ambulates well

## 2020-12-10 NOTE — ED Triage Notes (Signed)
Pt comes with c/o right knee pain that started last Tuesday. 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 Pt has redness and swelling noted to knee.

## 2020-12-10 NOTE — ED Provider Notes (Signed)
[facility] Emergency Department Provider Note  ____________________________________________   Event Date/Time   First MD Initiated Contact with Patient 12/10/20 1005     (approximate)  I have reviewed the triage vital signs and the nursing notes.   HISTORY  Chief Complaint Knee Pain    HPI Jeffrey Benton is a 50 y.o. male presents emergency department with right knee pain and swelling.  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  No numbness or tingling.  History reviewed. No pertinent past medical history.  There are no problems to display for this patient.   History reviewed. No pertinent surgical history.  Prior to Admission medications   Medication Sig Start Date End Date Taking? Authorizing Provider  metoprolol tartrate (LOPRESSOR) 25 MG tablet Take 1 tablet (25 mg total) by mouth 2 (two) times daily. 12/28/17 12/28/18  Sharman Cheek, MD    Allergies Patient has no known allergies.  No family history on file.  Social History Social History   Tobacco Use   Smoking status: Some Days   Smokeless tobacco: Never  Substance Use Topics   Alcohol use: Yes   Drug use: No    Review of Systems  Constitutional: No fever/chills Eyes: No visual changes. ENT: No sore throat. Respiratory: Denies cough Genitourinary: Negative for dysuria. Musculoskeletal: Negative for back pain.  Positive for right knee pain Skin: Negative for rash. Psychiatric: no mood changes,     ____________________________________________   PHYSICAL EXAM:  VITAL SIGNS: ED Triage Vitals  Enc Vitals Group     BP 12/10/20 0951 (!) 153/85     Pulse Rate 12/10/20 0951 82     Resp 12/10/20 0951 18     Temp 12/10/20 0951 98.8 F (37.1 C)     Temp Source 12/10/20 0951 Oral     SpO2 12/10/20 0951 97 %     Weight 12/10/20 1003 169 lb 15.6 oz (77.1 kg)     Height 12/10/20 1003 5\' 11"  (1.803 m)     Head  Circumference --      Peak Flow --      Pain Score 12/10/20 0952 8     Pain Loc --      Pain Edu? --      Excl. in GC? --     Constitutional: Alert and oriented. Well appearing and in no acute distress. Eyes: Conjunctivae are normal.  Head: Atraumatic. Nose: No congestion/rhinnorhea. Mouth/Throat: Mucous membranes are moist.   Neck:  supple no lymphadenopathy noted Cardiovascular: Normal rate, regular rhythm.  Respiratory: Normal respiratory effort.  No retractions, GU: deferred Musculoskeletal: FROM all extremities, warm and well perfused, patella bursa swollen and tender, mild warmth is noted, neurovascular is intact Neurologic:  Normal speech and language.  Skin:  Skin is warm, dry and intact. No rash noted. Psychiatric: Mood and affect are normal. Speech and behavior are normal.  ____________________________________________   LABS (all labs ordered are listed, but only abnormal results are displayed)  Labs Reviewed - No data to display ____________________________________________   ____________________________________________  RADIOLOGY  X-ray of the right knee  ____________________________________________   PROCEDURES  Procedure(s) performed: Knee brace applied by nursing staff   Procedures    ____________________________________________   INITIAL IMPRESSION / ASSESSMENT AND PLAN / ED COURSE  Pertinent labs & imaging results that were available during my care of the patient were reviewed by me and considered in my medical decision making (  see chart for details).   The patient is a 50 year old male presents with right knee pain.  See HPI.  Physical exam shows patient appears stable x-ray of the right knee reviewed by me confirmed by radiology to be negative for fracture but does show soft tissue swelling at the patella bursa.  I did explain all the findings to the patient.  Placed in knee brace.  He is to apply ice.  Take over-the-counter NSAIDs.  Follow-up  orthopedics if not improved in 1 week.  Work restrictions to include no kneeling for 1 week.  He was discharged stable condition     Jeffrey Benton was evaluated in Emergency Department on 12/10/2020 for the symptoms described in the history of present illness. He was evaluated in the context of the global COVID-19 pandemic, which necessitated consideration that the patient might be at risk for infection with the SARS-CoV-2 virus that causes COVID-19. Institutional protocols and algorithms that pertain to the evaluation of patients at risk for COVID-19 are in a state of rapid change based on information released by regulatory bodies including the CDC and federal and state organizations. These policies and algorithms were followed during the patient's care in the ED.    As part of my medical decision making, I reviewed the following data within the electronic MEDICAL RECORD NUMBER Nursing notes reviewed and incorporated, Old chart reviewed, Radiograph reviewed , Notes from prior ED visits, and [place] Controlled Substance Database  ____________________________________________   FINAL CLINICAL IMPRESSION(S) / ED DIAGNOSES  Final diagnoses:  Patellar bursitis of right knee      NEW MEDICATIONS STARTED DURING THIS VISIT:  New Prescriptions   No medications on file     Note:  This document was prepared using Dragon voice recognition software and may include unintentional dictation errors.    Faythe Ghee, PA-C 12/10/20 1152    Arnaldo Natal, MD 12/10/20 [phone]

## 2020-12-10 NOTE — Discharge Instructions (Signed)
Apply ice to the knee Do not kneel on the knee for at least 1 week Wear the knee brace Take otc ibuprofen or aleve to help with swelling and pain

## 2022-07-09 IMAGING — CR DG KNEE COMPLETE 4+V*R*
1 series · 4 of 4 positions shown · non-contrast
Comparison: None.

CLINICAL DATA: presents with pain and swelling to right knee x 1
week. Denies any injury but states he was on his knees putting in a
floor redness noted with some warmth to knee ambulates well.

EXAM:
RIGHT KNEE - COMPLETE 4 VIEW

[Series 1: dg knee complete 4 views right · 0.14mm/px · 4 of 4 slices shown]
[im 1/4]
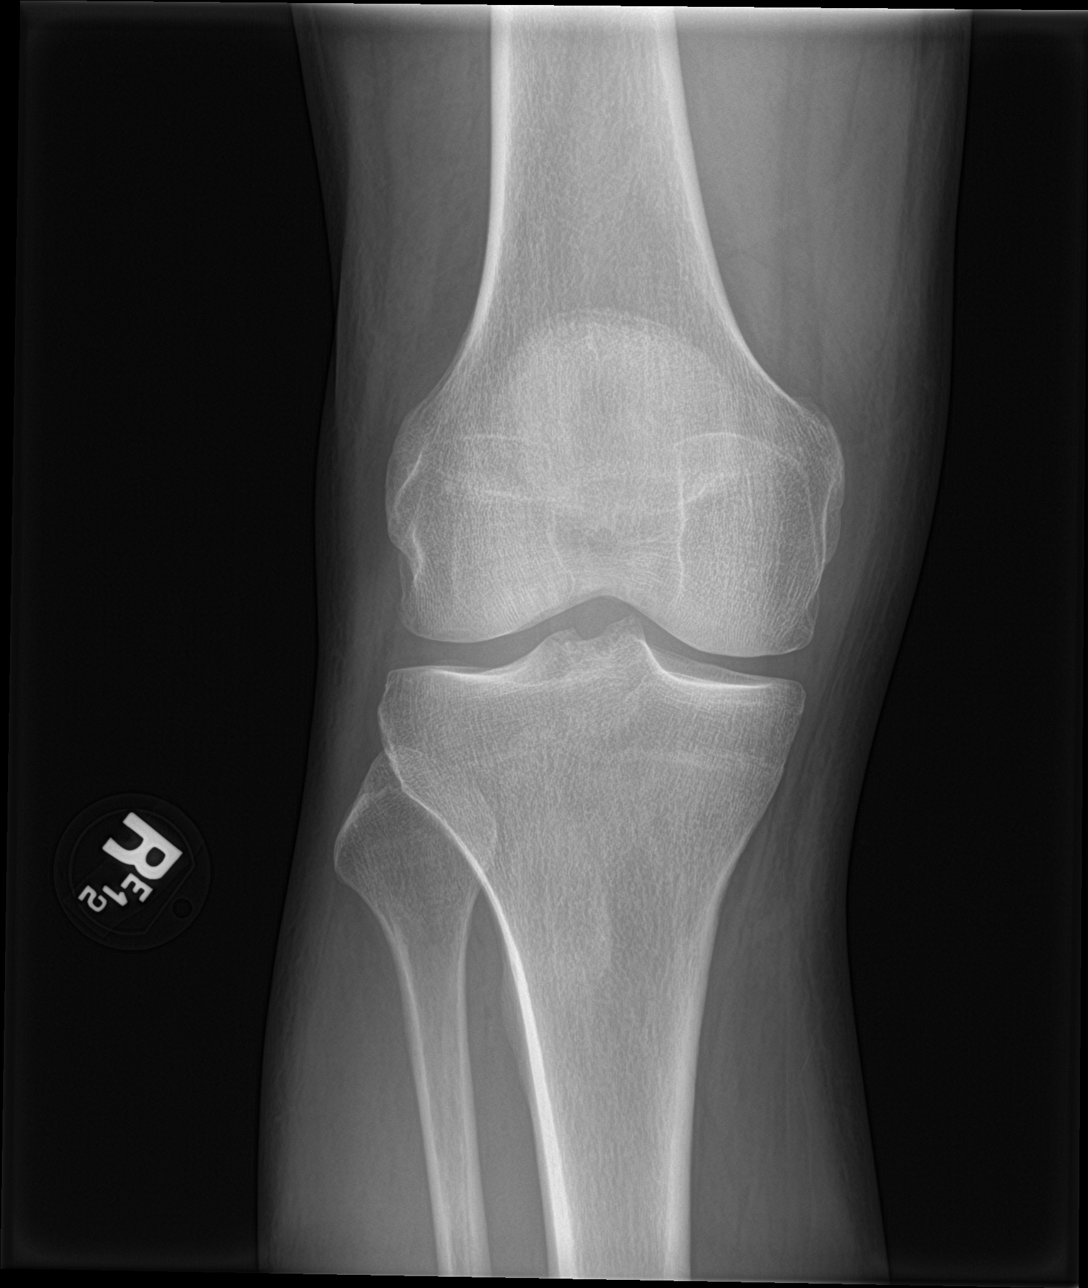
[im 2/4]
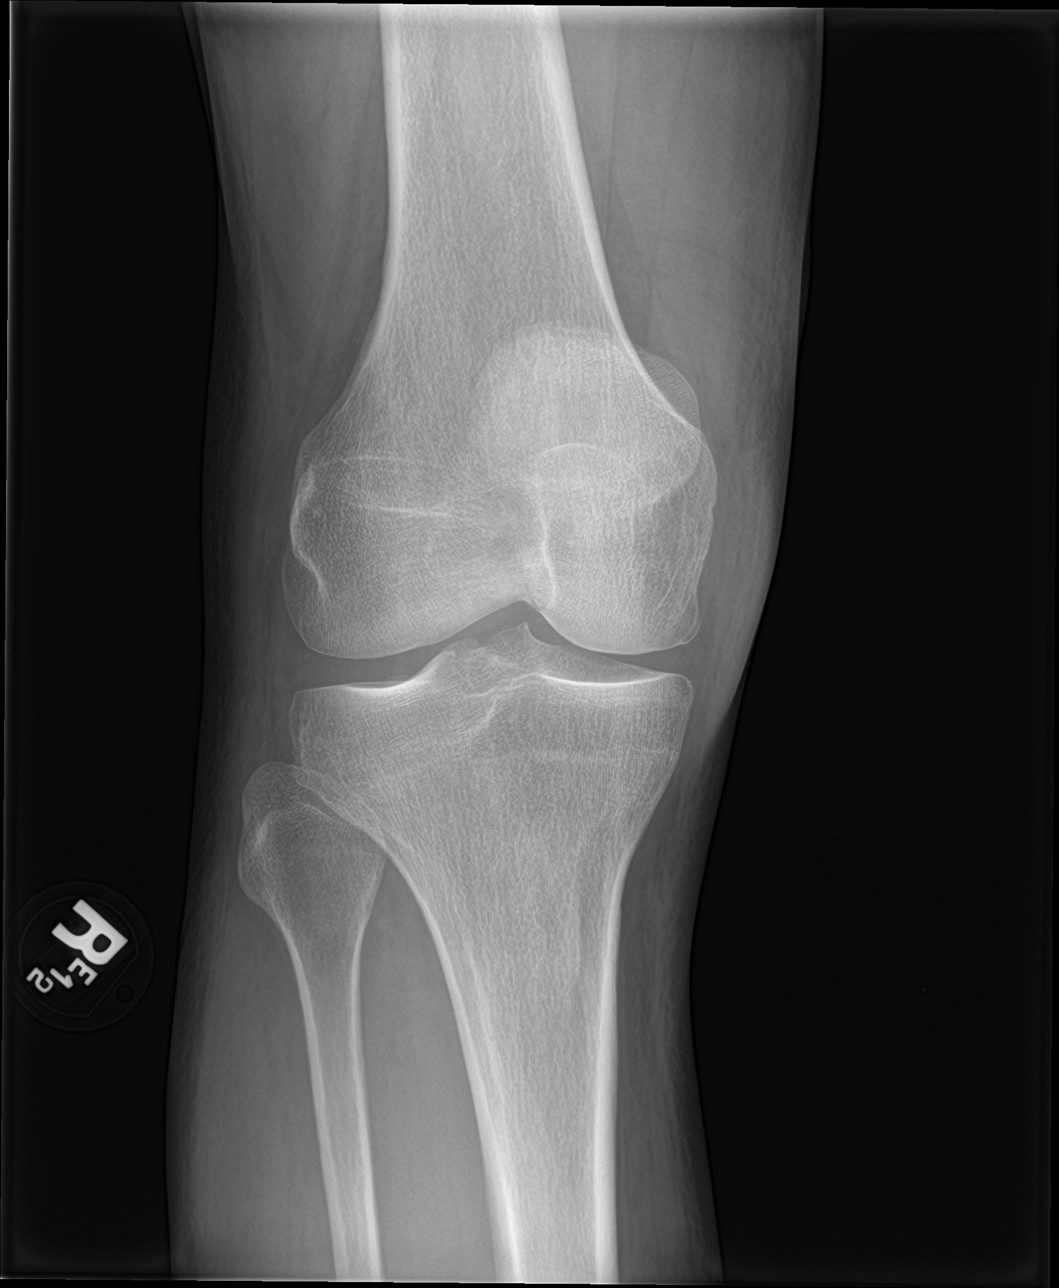
[im 3/4]
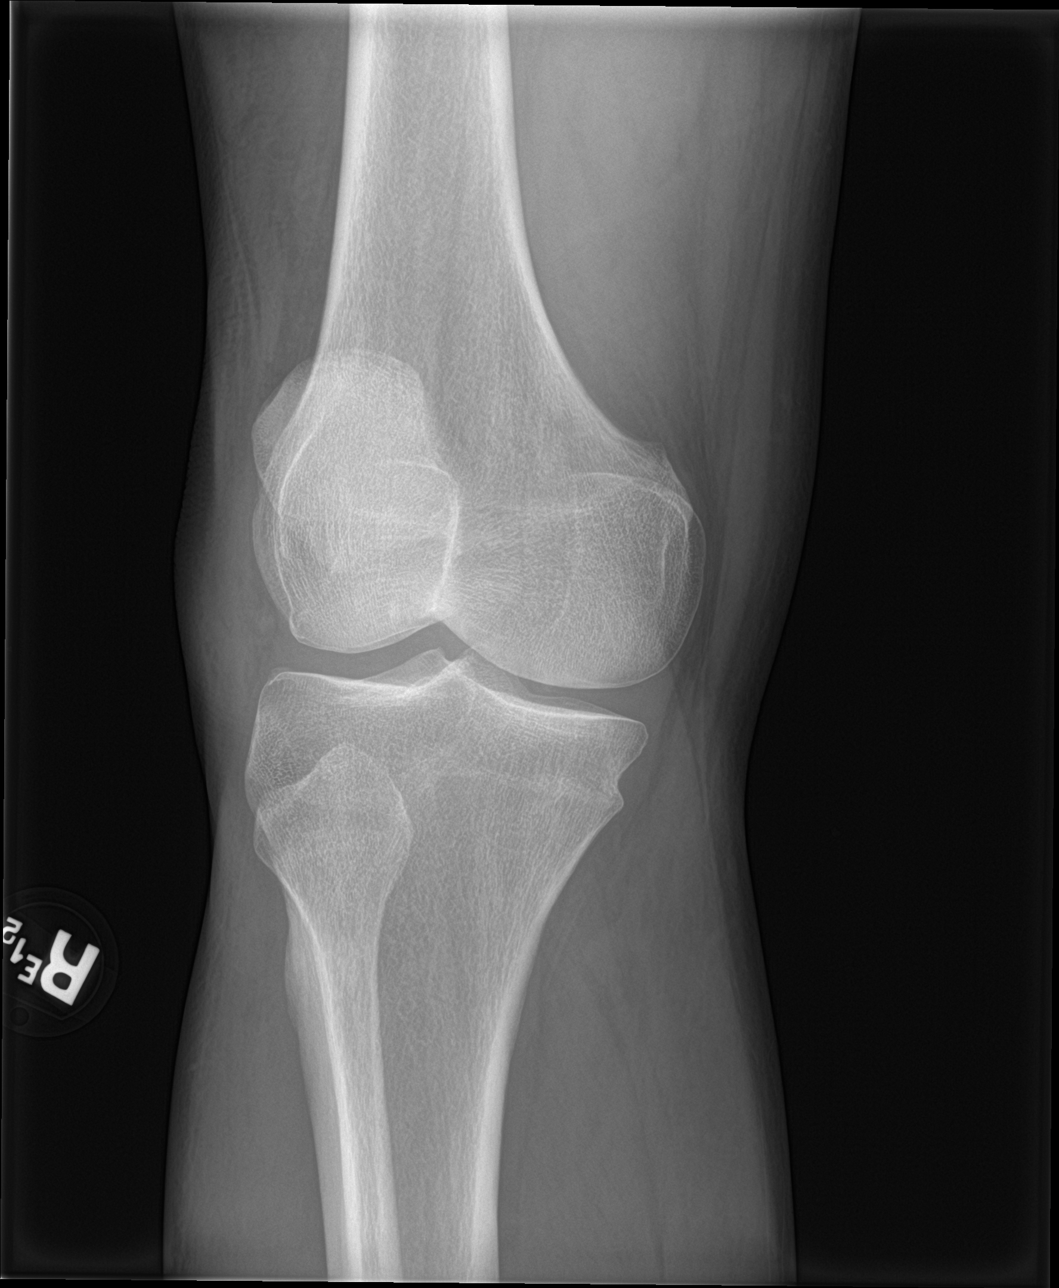
[im 4/4]
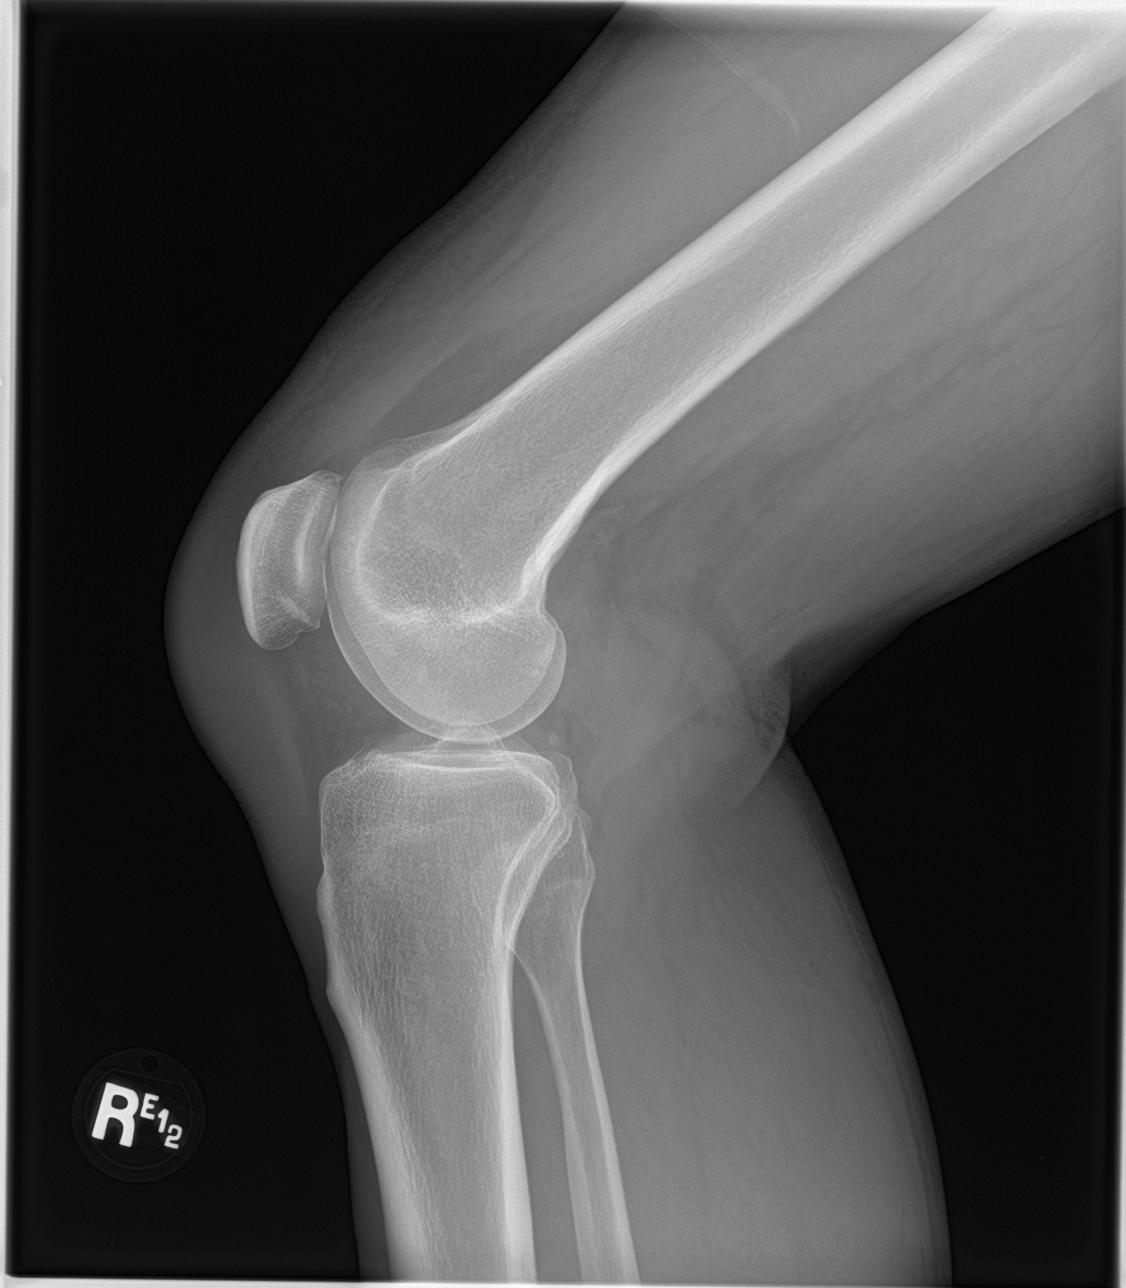

[4 of 4 positions shown; findings below may reference images not displayed]

FINDINGS: No fracture or bone lesion.

Knee joint is normally spaced and aligned. No significant
arthropathic change. No joint effusion.

There is anterior soft tissue swelling anterior to the patella and
patellar tendon.
IMPRESSION: 1. Anterior soft tissue swelling.
2. No fracture or knee joint abnormality or effusion.
